# Patient Record
Sex: Female | Born: 1950 | Race: Black or African American | Hispanic: No | Marital: Married | Smoking: Never smoker
Health system: Southern US, Community
[De-identification: ages and names within clinical notes are randomized; demographics above are authoritative.]

## PROBLEM LIST (undated history)

## (undated) DIAGNOSIS — D649 Anemia, unspecified: Secondary | ICD-10-CM

## (undated) DIAGNOSIS — E785 Hyperlipidemia, unspecified: Secondary | ICD-10-CM

## (undated) DIAGNOSIS — K8020 Calculus of gallbladder without cholecystitis without obstruction: Secondary | ICD-10-CM

## (undated) DIAGNOSIS — F329 Major depressive disorder, single episode, unspecified: Secondary | ICD-10-CM

## (undated) DIAGNOSIS — N200 Calculus of kidney: Secondary | ICD-10-CM

## (undated) DIAGNOSIS — F32A Depression, unspecified: Secondary | ICD-10-CM

## (undated) DIAGNOSIS — K635 Polyp of colon: Secondary | ICD-10-CM

## (undated) HISTORY — DX: Major depressive disorder, single episode, unspecified: F32.9

## (undated) HISTORY — DX: Calculus of gallbladder without cholecystitis without obstruction: K80.20

## (undated) HISTORY — DX: Calculus of kidney: N20.0

## (undated) HISTORY — PX: KNEE SURGERY: SHX244

## (undated) HISTORY — DX: Polyp of colon: K63.5

## (undated) HISTORY — DX: Anemia, unspecified: D64.9

## (undated) HISTORY — DX: Hyperlipidemia, unspecified: E78.5

## (undated) HISTORY — PX: ABDOMINAL HYSTERECTOMY: SHX81

## (undated) HISTORY — DX: Depression, unspecified: F32.A

## (undated) HISTORY — PX: CHOLECYSTECTOMY: SHX55

---

## 2001-10-01 ENCOUNTER — Inpatient Hospital Stay (HOSPITAL_COMMUNITY): Admission: EM | Admit: 2001-10-01 | Discharge: 2001-10-03 | Payer: Self-pay | Admitting: Emergency Medicine

## 2003-02-17 ENCOUNTER — Emergency Department (HOSPITAL_COMMUNITY): Admission: EM | Admit: 2003-02-17 | Discharge: 2003-02-18 | Payer: Self-pay | Admitting: Emergency Medicine

## 2003-02-18 ENCOUNTER — Encounter: Payer: Self-pay | Admitting: Emergency Medicine

## 2005-10-02 ENCOUNTER — Other Ambulatory Visit: Admission: RE | Admit: 2005-10-02 | Discharge: 2005-10-02 | Payer: Self-pay | Admitting: Obstetrics and Gynecology

## 2005-12-10 ENCOUNTER — Ambulatory Visit (HOSPITAL_BASED_OUTPATIENT_CLINIC_OR_DEPARTMENT_OTHER): Admission: RE | Admit: 2005-12-10 | Discharge: 2005-12-10 | Payer: Self-pay | Admitting: Specialist

## 2006-02-07 ENCOUNTER — Encounter: Admission: RE | Admit: 2006-02-07 | Discharge: 2006-02-07 | Payer: Self-pay | Admitting: Obstetrics and Gynecology

## 2006-06-18 ENCOUNTER — Emergency Department (HOSPITAL_COMMUNITY): Admission: EM | Admit: 2006-06-18 | Discharge: 2006-06-18 | Payer: Self-pay | Admitting: Emergency Medicine

## 2006-06-27 ENCOUNTER — Encounter: Admission: RE | Admit: 2006-06-27 | Discharge: 2006-06-27 | Payer: Self-pay | Admitting: Gastroenterology

## 2006-09-18 ENCOUNTER — Ambulatory Visit (HOSPITAL_COMMUNITY): Admission: RE | Admit: 2006-09-18 | Discharge: 2006-09-18 | Payer: Self-pay | Admitting: General Surgery

## 2006-09-18 ENCOUNTER — Encounter (INDEPENDENT_AMBULATORY_CARE_PROVIDER_SITE_OTHER): Payer: Self-pay | Admitting: Specialist

## 2007-01-05 ENCOUNTER — Emergency Department (HOSPITAL_COMMUNITY): Admission: EM | Admit: 2007-01-05 | Discharge: 2007-01-05 | Payer: Self-pay | Admitting: Emergency Medicine

## 2007-07-24 ENCOUNTER — Encounter: Admission: RE | Admit: 2007-07-24 | Discharge: 2007-07-24 | Payer: Self-pay | Admitting: Obstetrics and Gynecology

## 2007-08-07 ENCOUNTER — Encounter: Admission: RE | Admit: 2007-08-07 | Discharge: 2007-08-07 | Payer: Self-pay | Admitting: Obstetrics and Gynecology

## 2008-08-16 ENCOUNTER — Encounter: Admission: RE | Admit: 2008-08-16 | Discharge: 2008-08-16 | Payer: Self-pay | Admitting: Obstetrics and Gynecology

## 2008-11-14 ENCOUNTER — Emergency Department (HOSPITAL_COMMUNITY): Admission: EM | Admit: 2008-11-14 | Discharge: 2008-11-14 | Payer: Self-pay | Admitting: Emergency Medicine

## 2008-11-14 IMAGING — CR DG FINGER INDEX 2+V*R*
3 series · 3 of 3 positions shown · non-contrast
Comparison: None

CLINICAL DATA: Trauma to the right index finger.  Laceration over
the posterior aspect of the distal phalanx.

RIGHT INDEX FINGER 2+V

[x finger pa right]
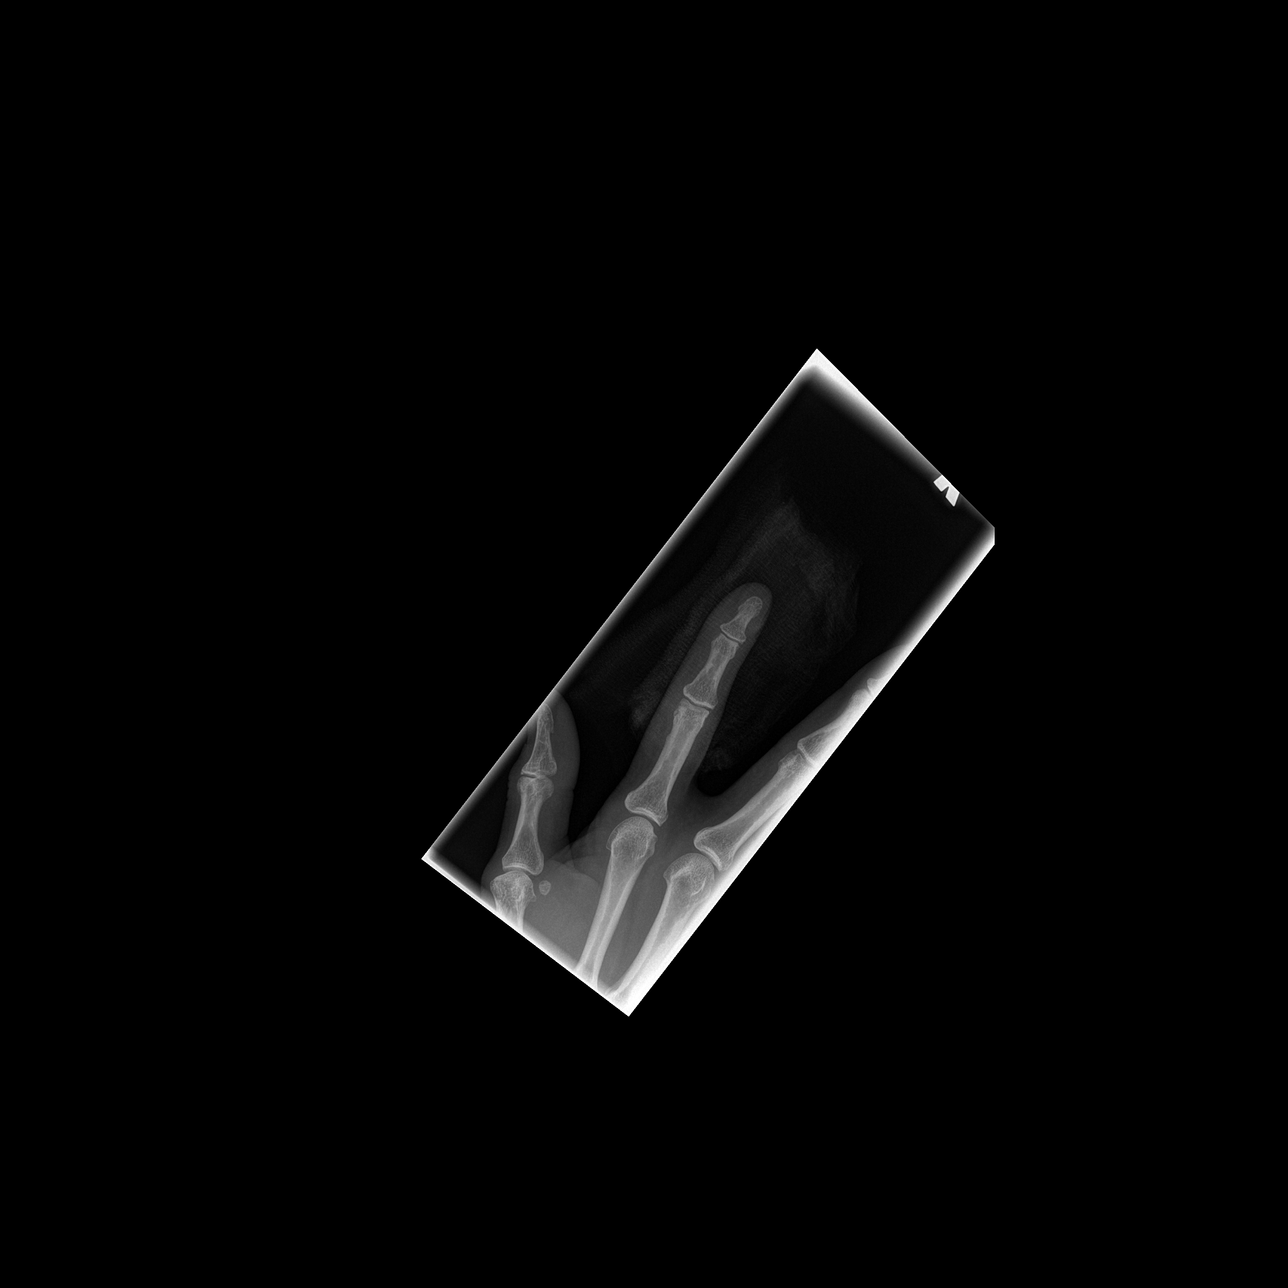

[x finger obl. right]
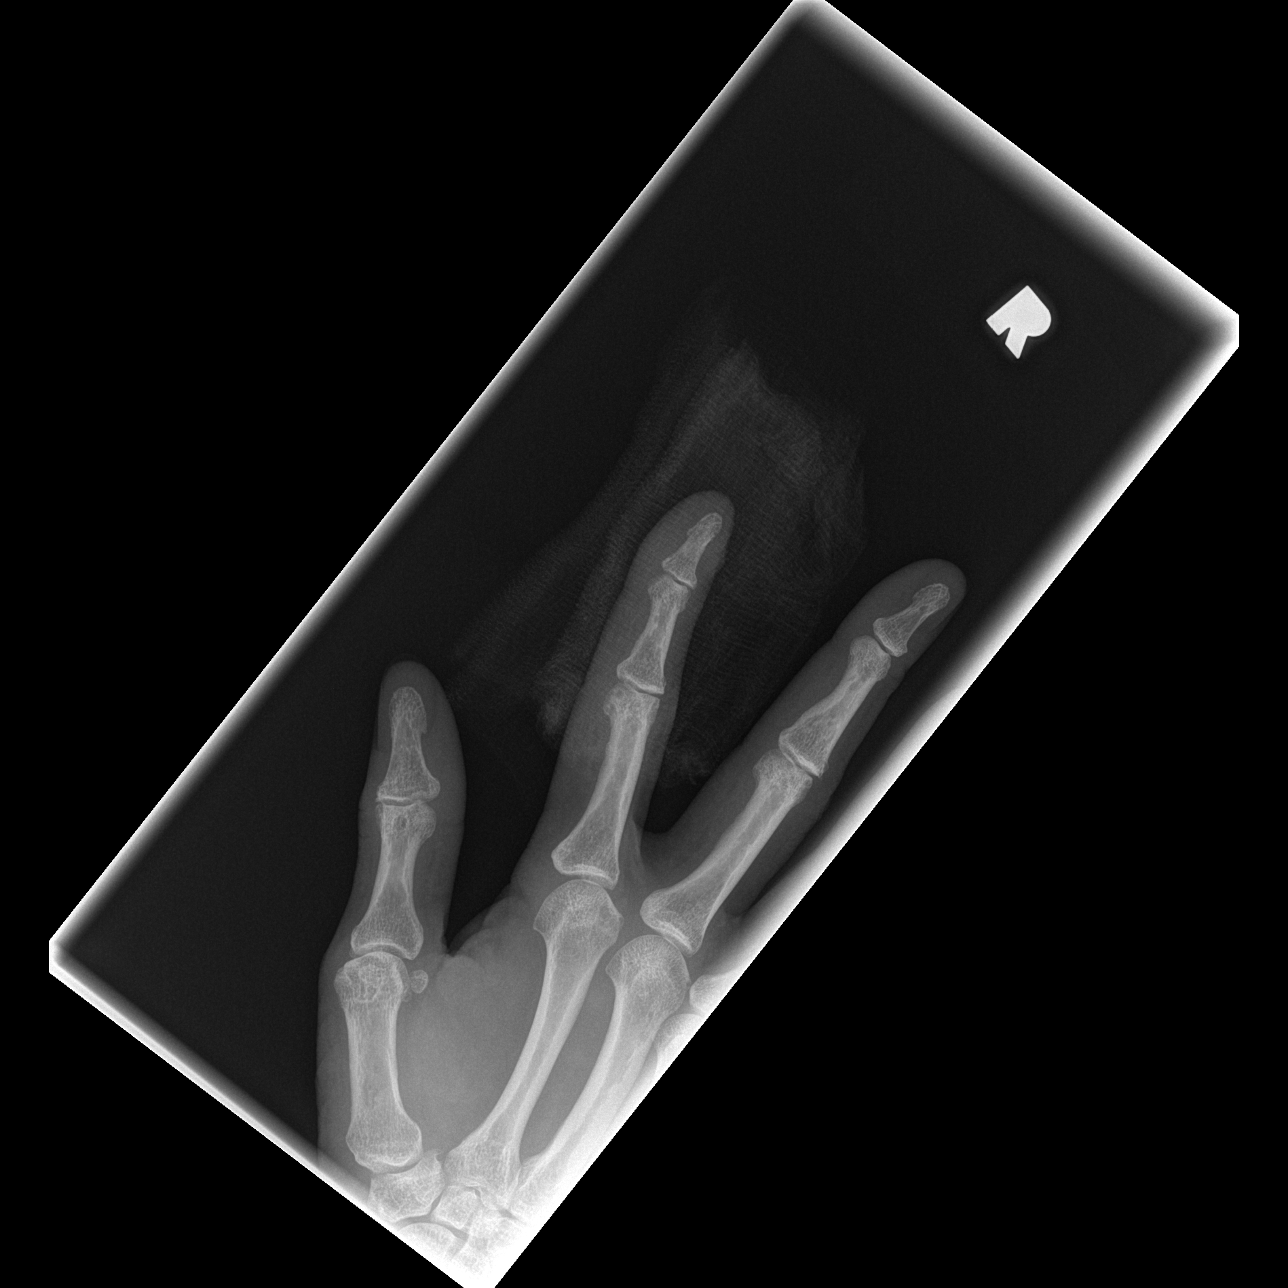

[x finger lateral right]
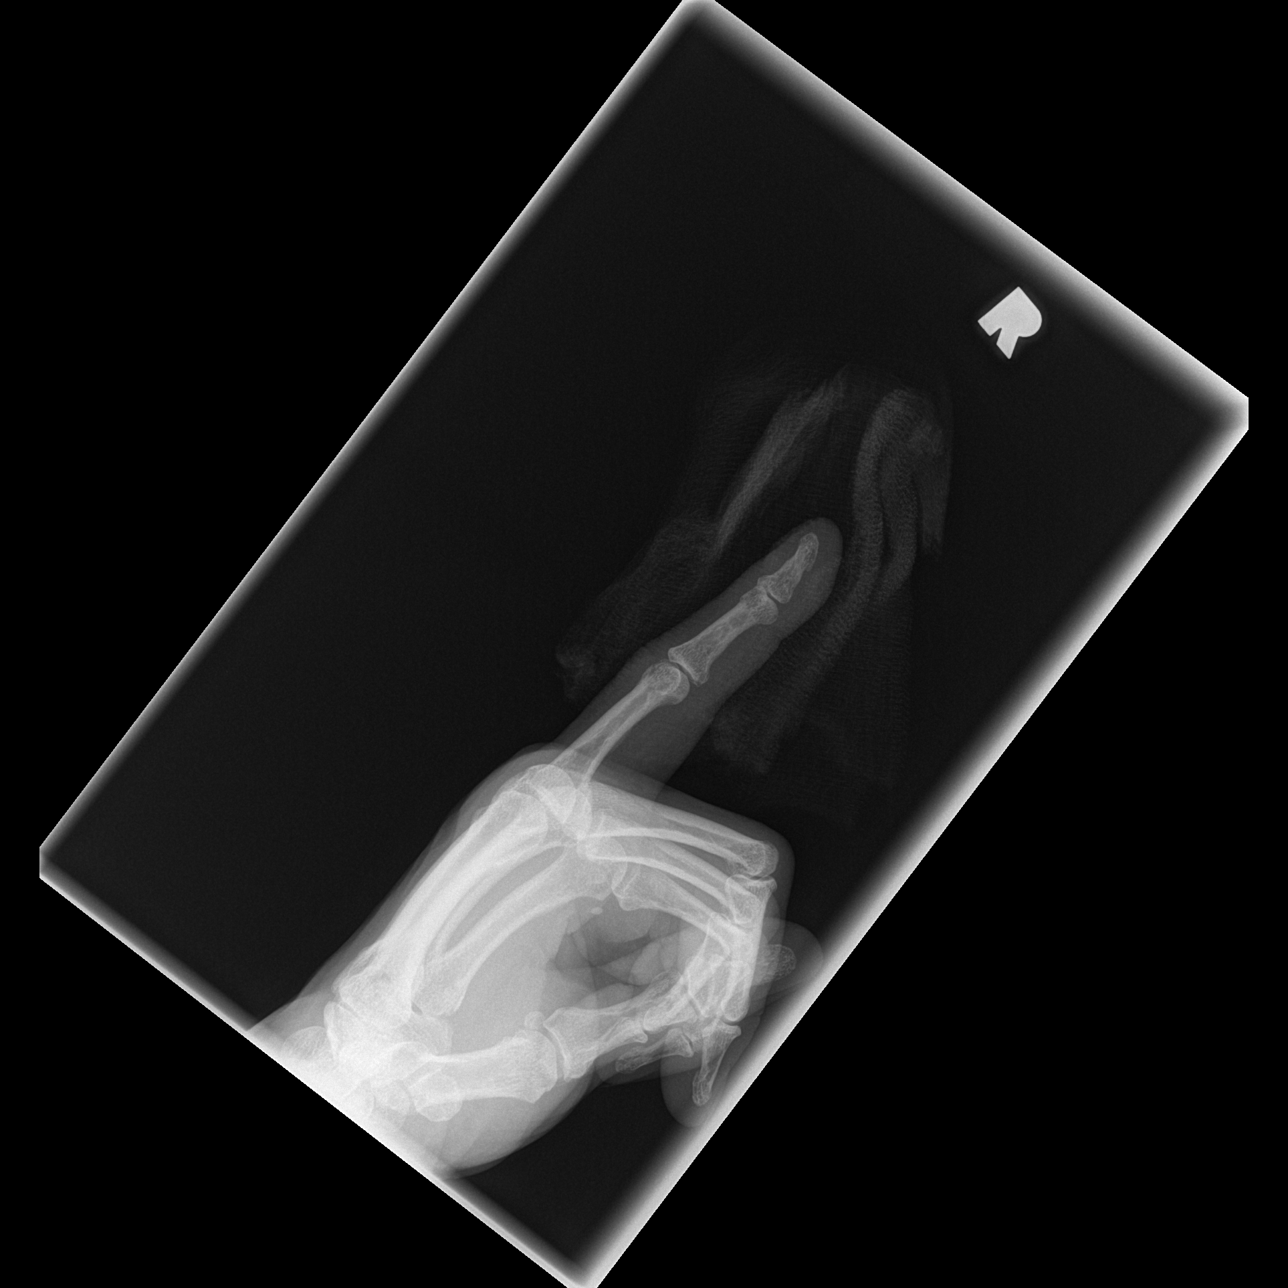

[3 of 3 positions shown; findings below may reference images not displayed]

FINDINGS: Gauze overlies the right second digit and obscures fine
osseous detail.  No radiopaque foreign body is identified.  No
definite tuft fracture is seen.
IMPRESSION: No definite fracture identified in the right second digit, although
fine detail is obscured by the gauze.  If there is high clinical
suspicion for fracture, images could be repeated without gauze
present.

## 2009-09-23 ENCOUNTER — Encounter: Admission: RE | Admit: 2009-09-23 | Discharge: 2009-09-23 | Payer: Self-pay | Admitting: Obstetrics and Gynecology

## 2010-06-11 ENCOUNTER — Encounter: Payer: Self-pay | Admitting: Obstetrics and Gynecology

## 2010-10-06 NOTE — H&P (Signed)
[place]. [facility]  Patient:    Shirley Aguilar, Shirley Aguilar Visit Number: [id number] MRN: [id number]          Service Type: MED Location: [phone] Attending Physician:  Rogelia Boga Dictated by:   Gordy Savers, M.D. Admit Date:  09/30/2001                           History and Physical  CHIEF COMPLAINT:  Nausea and vomiting.  HISTORY OF PRESENT ILLNESS:  The patient is a 60 year old black female who was stable until one day prior to admission.  She returned from work feeling well and following dinner had the onset of nausea and vomiting associated with crampy abdominal pain.  She states that she ate chicken that was purchased one week prior but remained refrigerated but not frozen.  Her husband ate dinner, but had not consumed the same portions of chicken. The patient denies any significant GI history and no further episodes in the past.  She enjoys excellent health and takes no chronic medications.  There has been no fever.  Several hours into the illness, she had the onset of diarrhea.  The patient was treated in the emergency department where septic series revealed normal findings.  LABORATORY STUDIES:  Normal white count and normal chemistries.  The patient was treated symptomatically with Phenergan, Zofran but continued to have intractable nausea and vomiting and finally nasogastric tube was placed with resolution of her symptoms.  The patient is now admitted for further evaluation and treatment of intractable nausea and vomiting.  PAST MEDICAL HISTORY:  Fairly noncontributory.  She is Gravida 2, Para 2, Abortis 0.  She has been hospitalized on two occasions for surgery for ovarian cysts or tumors, more recently here in [facility] by Dr. Huntley Dec.  She takes no chronic medications and is in the process of tapering hormone replacement therapy.  FAMILY HISTORY:  Father died when she was young, unclear causes.  Mother is in good  health as are one brother and four sisters.  One sister has probably obstructive sleep apnea.  PHYSICAL EXAMINATION:  VITAL SIGNS:  Afebrile, vital signs were stable.  Pulse mid 70s.  The patient was in no distress.  SKIN:  Unremarkable.  HEENT:  Normal pupillary responses.  Conjunctivae are clear.  ENT negative. Nasogastric tube in place  CHEST:  Clear anterolaterally.  CARDIOVASCULAR:  Normal S1 and S2, no murmurs or gallops.  ABDOMEN:  Quiet, nondistended.  There is mild generalized tenderness but no guarding.  EXTREMITIES:  Extremities are negative.  PULSES:  Peripheral pulses are full.  IMPRESSION:  Intractable nausea and vomiting, probable food poisoning.  DISPOSITION:  Will admit to the hospital, support with IV fluids and continue symptomatic treatment.  Will continue on Pepcid 10 mg IV every 12 hours.  Her nasogastric tube will be clamped.  She will remain stable and after several hours this will be discontinued.  She will be challenged with clear liquid diet. Dictated by:   Gordy Savers, M.D. Attending Physician:  Rogelia Boga DD:  10/01/01 TD:  10/02/01 Job: [id number]

## 2010-10-06 NOTE — Op Note (Signed)
Shirley Aguilar, Shirley Aguilar             ACCOUNT NO.:  [id number]   MEDICAL RECORD NO.:  [id number]          PATIENT TYPE:  AMB   LOCATION:  DAY                          FACILITY[facility]  [facility]   PHYSICIAN:  Adolph Pollack, M.D.DATE OF BIRTH:  [birth year]   DATE OF PROCEDURE:  09/18/2006  DATE OF DISCHARGE:                               OPERATIVE REPORT   PREOPERATIVE DIAGNOSIS:  Symptomatic cholelithiasis.   POSTOPERATIVE DIAGNOSIS:  Symptomatic cholelithiasis.   PROCEDURE:  Laparoscopic cholecystectomy with intraoperative  cholangiogram.   SURGEON:  Adolph Pollack, M.D.   ASSISTANT:  Karie Soda, M.D.   ANESTHESIA:  General.   INDICATION:  Ms. Witz is a 60 year old female who has had two  significant bouts of right upper quadrant pain with nausea and vomiting.  She ended up in the emergency room after one of these bouts.  She had an  ultrasound demonstrating multiple gallstones but no gallbladder wall  thickening.  The common bile duct was normal measuring 5.4 mm.  She  subsequently now presents for laparoscopic cholecystectomy.  We have  discussed the procedure and risks preoperatively.   TECHNIQUE:  She is brought to the operating room, placed supine on the  operating table, and a general anesthetic was administered.  The  abdominal wall was sterilely prepped and draped.  Marcaine was  infiltrated in the supraumbilical region.  A small supraumbilical  incision was made through the skin, subcutaneous tissue, fascia, and  peritoneum entering the peritoneal cavity under direct vision.  A  pursestring suture of 0 Vicryl was placed around the fascial edges.  A  Hassan trocar was introduced into the peritoneal cavity and the  pneumoperitoneum was created by insufflation of CO2 gas.   The laparoscope was introduced.  She was placed in reversed  Trendelenburg position and the right side tilted slightly up.  An 11 mm  trocar was placed in the epigastrium and two 5 mm trocars placed  in the  right upper quadrant.  The fundus of the gallbladder was grasped.  The  gallbladder was abnormal appearing to have a pale red color consistent  with chronic inflammatory changes.  The fundus was retracted toward the  right shoulder.  There were thin adhesions between the duodenum and the  body and infundibulum of the gallbladder which were lysed sharply and  the duodenum was carefully swept bluntly away.   The infundibulum was then grasped and using careful dissection on the  gallbladder, it was mobilized.  The gallbladder neck tapered down to a  slightly enlarged cystic duct.  I created a window around the cystic  duct as well as creating windows around the anterior and posterior  branches of the cystic artery close to the gallbladder.  I placed a clip  at the gallbladder neck and made an incision in the cystic duct.  I then  milked some debris back to the cystic duct out through the incision in  it.  A cholangiocath was passed through the anterior abdominal wall and  a cholangiogram performed.   Under real time fluoroscopy, dilute contrast was injected into the  cystic duct area.  The cystic duct was relatively short.  The common  hepatic, right and left hepatic, and common bile ducts were all  opacified and contrast passed into the duodenum without obvious evidence  of obstruction.  Final ports were pending the radiologist's  interpretation.  I removed the cholangiocath.  I clipped the cystic duct  twice and divided it.  I then placed an Endoloop on the cystic duct as  well given it a slightly increased diameter.   The gallbladder was then dissected free from the liver using  electrocautery and placed in an Endopouch bag.  I copiously irrigated  out the gallbladder fossa.  No bleeding or bile leak was noted.  Irrigation fluid was evacuated as much as possible.  The gallbladder was  then removed in the bag through the supraumbilical port and the  supraumbilical fascial  defect was closed under laparoscopic vision by  tightening up and tying down the pursestring suture.  The remaining  trocars were removed and the pneumoperitoneum was released.  The skin  incisions were closed with a 4-0 Monocryl subcuticular stitches.  Steri-  Strips and sterile dressings were applied.   She tolerated the procedure well without any apparent complications and  was taken to recovery in satisfactory condition.      Adolph Pollack, M.D.  Electronically Signed     TJR/MEDQ  D:  09/18/2006  T:  09/18/2006  Job:  [id number]   cc:   Graylin Shiver, M.D.  Fax: [phone]

## 2010-10-06 NOTE — Discharge Summary (Signed)
[place]. [facility]  Patient:    Shirley Aguilar, Shirley Aguilar Visit Number: [id number] MRN: [id number]          Service Type: MED Location: [phone] Attending Physician:  Rogelia Boga Dictated by:   Cornell Barman, P.A. Admit Date:  09/30/2001 Discharge Date: 10/03/2001   CC:         Gordy Savers, M.D.   Discharge Summary  DISCHARGE DIAGNOSES: 1. Intractable nausea and vomiting. 2. Mildly elevated temperature.  BRIEF HISTORY:  Shirley Aguilar is a 60 year old African-American female.  She was stable until the day prior to this admission.  She had returned from work feeling well, and following dinner had developed nausea and vomiting.  This was associated with crampy abdominal pain.  She did have some diarrhea as well.  The patient was evaluated in the emergency department where acute abdominal series showed no acute abnormalities.  The patient was admitted for IV fluids and antiemetics.  PAST MEDICAL HISTORY:  History of ovarian cysts or tumors reportedly benign followed by Dr. Henderson Cloud.  HOSPITAL COURSE:  #1 - GI.  The patient presented with intractable nausea and vomiting and some diarrhea.  Acute abdominal series was unremarkable.  Urinalysis was unremarkable.  Hepatic function profile was normal.  Lipase was normal.  The patient had an NG tube placed for decompression.  The patient symptomatically improved with NG tube decompression and antiemetics.  The patient does give a history of total abdominal hysterectomy secondary to tumor greater than two years ago, but denies any history of adhesions.  The patients condition did improve with IV fluids, NG tube decompression, and antiemetics.  The NG tube was discontinued and she was started on clear liquids which she tolerated. Currently her IV fluids have been discontinued and her diet has been advanced. Most likely this was a viral gastroenteritis.  The patient is medically stable for  discharge.  DISCHARGE LABORATORY DATA:  CBC with differential was normal.  Potassium was 3.4, lipase was 26.  Urinalysis revealed trace leukocyte esterase with 0 to 2 white cells.  Hepatic function profile was normal.  DISCHARGE MEDICATIONS:  None.  FOLLOW-UP:  At [facility] as needed. Dictated by:   Cornell Barman, P.A. Attending Physician:  Rogelia Boga DD:  10/03/01 TD:  10/06/01 Job: [id number]

## 2010-10-06 NOTE — Op Note (Signed)
Shirley, Aguilar             ACCOUNT NO.:  [id number]   MEDICAL RECORD NO.:  [id number]          PATIENT TYPE:  AMB   LOCATION:  [facility]                         FACILITY:  [facility]   PHYSICIAN:  Jene Every, M.D.    DATE OF BIRTH:  08-Jan-1951   DATE OF PROCEDURE:  12/10/2005  DATE OF DISCHARGE:                                 OPERATIVE REPORT   PREOPERATIVE DIAGNOSES:  Lateral meniscus tear, medial meniscus tear.   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   PROCEDURE PERFORMED:  Right knee arthroscopy, partial medial and lateral  meniscectomies, chondroplasties of the medial and femoral condyle and  lateral tibial plateau and of the patella.   BRIEF HISTORY AND INDICATIONS:  A 60 year old with knee pain.  MRI indicated  anterior horn and lateral meniscus tear and tricompartmental degenerative  changes.  She has been refractory to conservative treatment despite rest,  corticosteroid injection, anti-inflammatories.  MRI indicating tear with  mechanical symptoms, she was indicated for partial meniscectomy and  debridement.  Risks and benefits discussed, including bleeding, infection,  injury to vascular structures, no change in symptoms, worsening of symptoms,  need for repeat debridement in the future, or total knee arthroplasty.   TECHNIQUE:  With the patient in the supine position, after the induction of  adequate general anesthesia and 1 gram of Kefzol, the right lower extremity  was prepped and draped in the usual sterile fashion.  A lateral parapatellar  portal and a superomedial parapatellar portal were fashioned with a #11  blade after localization.  An ingress cannula was introduced, and irrigant  was utilized to insufflate the  joint.  Camera inserted laterally.  Under  direct visualization, a medial parapatellar portal was fashioned with a #11  blade after localization with an 18-gauge needle, sparing the medial  meniscus.  Noted initially was some loose cartilaginous material.  It was  evacuated.  There was a large chondral flap tear and extensive grade 3  changes of the medial femoral condyle.  There was a complex tear of the  posterior horn of the medial meniscus, unstable.  Basket rongeur was  introduced and utilized to perform a partial medial meniscectomy to a stable  base.  Chondral flap tears were debrided with a 42 Kuda shaver.  There were  extensive changes noted.  The remnant of the meniscus was stable to probe  palpation.  This was the inner two thirds that were resected of the  posterior third.  ACL and PCL were unremarkable.  There was a tear of the  anterior horn of lateral meniscus as well as the mid point of the meniscus,  and there were extensive grade 3 changes and small grade 4 changes of the  tibial plateau and grade 3 changes of the femoral condyle.  The shaver was  introduced and utilized to shave and perform a partial lateral meniscectomy  of the anterior  horn and chondroplasties of the tibial plateau and of the  femoral condyle.  The remainder of the meniscus was stable to probe  palpation without evidence of tear.  Chondroplasty of the patella was  performed.  There was normal patellofemoral tracking.  The gutters were  unremarkable.  The knee was copiously lavaged.  I revisited all the  compartments, and there was no loose cartilaginous debris or other pathology  amenable to arthroscopic intervention.  Next, all instrumentation was  removed.  The portals were closed with 4-0 nylon in a simple suture, and  0.25% Marcaine was infiltrated, and the  joint wounds dressed sterilely.  She was awakened without difficulty and  transported to the recovery room in satisfactory condition.   The  patient tolerated the procedure well with no complications.      Jene Every, M.D.  Electronically Signed     JB/MEDQ  D:  12/10/2005  T:  12/10/2005  Job:  [id number]

## 2010-12-04 ENCOUNTER — Other Ambulatory Visit: Payer: Self-pay | Admitting: Obstetrics and Gynecology

## 2010-12-04 DIAGNOSIS — Z1231 Encounter for screening mammogram for malignant neoplasm of breast: Secondary | ICD-10-CM

## 2010-12-08 ENCOUNTER — Ambulatory Visit
Admission: RE | Admit: 2010-12-08 | Discharge: 2010-12-08 | Disposition: A | Discharge status: Home / Self Care | Payer: BC Managed Care – PPO | Source: Ambulatory Visit | Attending: Obstetrics and Gynecology | Admitting: Obstetrics and Gynecology

## 2010-12-08 DIAGNOSIS — Z1231 Encounter for screening mammogram for malignant neoplasm of breast: Secondary | ICD-10-CM

## 2011-03-02 LAB — COMPREHENSIVE METABOLIC PANEL
Albumin: 3.7
CO2: 28
Calcium: 9
Chloride: 111
Creatinine, Ser: 0.78
GFR calc non Af Amer: >60
Glucose, Bld: 121 — ABNORMAL HIGH
Sodium: 145
Total Protein: 6.7

## 2011-03-02 LAB — DIFFERENTIAL
Lymphocytes Relative: 4 — ABNORMAL LOW
Lymphs Abs: 0.4 — ABNORMAL LOW
Monocytes Relative: 8
Neutrophils Relative %: 84 — ABNORMAL HIGH

## 2011-03-02 LAB — CBC
HCT: 37.6
MCHC: 33
Platelets: 174
RDW: 14.2 — ABNORMAL HIGH
WBC: 11.3 — ABNORMAL HIGH

## 2011-03-02 LAB — URINALYSIS, ROUTINE W REFLEX MICROSCOPIC
Glucose, UA: NEGATIVE
Nitrite: NEGATIVE
Protein, ur: NEGATIVE

## 2011-03-02 LAB — LIPASE, BLOOD: Lipase: 19

## 2012-05-27 ENCOUNTER — Other Ambulatory Visit: Payer: Self-pay | Admitting: Gynecology

## 2012-05-27 ENCOUNTER — Other Ambulatory Visit: Payer: Self-pay | Admitting: Obstetrics and Gynecology

## 2012-05-27 DIAGNOSIS — R928 Other abnormal and inconclusive findings on diagnostic imaging of breast: Secondary | ICD-10-CM

## 2012-06-03 ENCOUNTER — Ambulatory Visit
Admission: RE | Admit: 2012-06-03 | Discharge: 2012-06-03 | Disposition: A | Discharge status: Home / Self Care | Payer: BC Managed Care – PPO | Source: Ambulatory Visit | Attending: Gynecology | Admitting: Gynecology

## 2012-06-03 DIAGNOSIS — R928 Other abnormal and inconclusive findings on diagnostic imaging of breast: Secondary | ICD-10-CM

## 2012-08-28 ENCOUNTER — Other Ambulatory Visit: Payer: Self-pay | Admitting: Physician Assistant

## 2012-08-28 ENCOUNTER — Ambulatory Visit
Admission: RE | Admit: 2012-08-28 | Discharge: 2012-08-28 | Disposition: A | Discharge status: Home / Self Care | Payer: No Typology Code available for payment source | Source: Ambulatory Visit | Attending: Physician Assistant | Admitting: Physician Assistant

## 2012-08-28 DIAGNOSIS — R52 Pain, unspecified: Secondary | ICD-10-CM

## 2013-04-01 ENCOUNTER — Emergency Department (INDEPENDENT_AMBULATORY_CARE_PROVIDER_SITE_OTHER)
Admission: EM | Admit: 2013-04-01 | Discharge: 2013-04-01 | Disposition: A | Discharge status: Home / Self Care | Payer: Self-pay | Source: Home / Self Care | Attending: Emergency Medicine | Admitting: Emergency Medicine

## 2013-04-01 ENCOUNTER — Encounter (HOSPITAL_COMMUNITY): Payer: Self-pay | Admitting: Emergency Medicine

## 2013-04-01 DIAGNOSIS — E119 Type 2 diabetes mellitus without complications: Secondary | ICD-10-CM

## 2013-04-01 DIAGNOSIS — J029 Acute pharyngitis, unspecified: Secondary | ICD-10-CM

## 2013-04-01 DIAGNOSIS — H1045 Other chronic allergic conjunctivitis: Secondary | ICD-10-CM

## 2013-04-01 DIAGNOSIS — H1013 Acute atopic conjunctivitis, bilateral: Secondary | ICD-10-CM

## 2013-04-01 NOTE — ED Notes (Signed)
C/o ST since yesterday, w nasal congestion; NAD, afebrile at present

## 2013-04-01 NOTE — ED Provider Notes (Signed)
CSN: [id number]     Arrival date & time 04/01/13  6962 History   First MD Initiated Contact with Patient 04/01/13 0827     Chief Complaint  Patient presents with  . Sore Throat   (Consider location/radiation/quality/duration/timing/severity/associated sxs/prior Treatment) HPI Comments: 62f presents complaining of sore throat since yesterday as well as nasal congestion. Also, she mentions that her A1c was 6.8 a recent health fair and she has been having itchy eyes with intermittent blurred vision for about 6 months. Sore throat nasal congestion have been present for about 3 days. She has taken over-the-counter medications which do help slightly. She denies fever, chills, recent travel, sick contacts, cough, throat swelling.  Patient is a 62 y.o. female presenting with pharyngitis.  Sore Throat Pertinent negatives include no chest pain, no abdominal pain and no shortness of breath.    History reviewed. No pertinent past medical history. Past Surgical History  Procedure Laterality Date  . Cholecystectomy     History reviewed. No pertinent family history. History  Substance Use Topics  . Smoking status: Never Smoker   . Smokeless tobacco: Not on file  . Alcohol Use: No   OB History   Grav Para Term Preterm Abortions TAB SAB Ect Mult Living                 Review of Systems  Constitutional: Negative for fever and chills.  HENT: Positive for congestion and sore throat.   Eyes: Positive for itching and visual disturbance.  Respiratory: Negative for cough and shortness of breath.   Cardiovascular: Negative for chest pain, palpitations and leg swelling.  Gastrointestinal: Negative for nausea, vomiting and abdominal pain.  Endocrine: Negative for polydipsia and polyuria.  Genitourinary: Negative for dysuria, urgency and frequency.  Musculoskeletal: Negative for arthralgias and myalgias.  Skin: Negative for rash.  Neurological: Negative for dizziness, weakness and light-headedness.     Allergies  Review of patient's allergies indicates no known allergies.  Home Medications  No current outpatient prescriptions on file. BP 141/86  Pulse 63  Temp(Src) 97.4 F (36.3 C) (Oral)  Resp 16  SpO2 98% Physical Exam  Nursing note and vitals reviewed. Constitutional: She is oriented to person, place, and time. Vital signs are normal. She appears well-developed and well-nourished. No distress.  HENT:  Head: Normocephalic and atraumatic.  Mouth/Throat: Posterior oropharyngeal erythema (mild) present. No oropharyngeal exudate, posterior oropharyngeal edema or tonsillar abscesses.  Eyes: Conjunctivae and EOM are normal. Pupils are equal, round, and reactive to light. Right eye exhibits no discharge. Left eye exhibits no discharge. No scleral icterus.  Neck: Normal range of motion. Neck supple.  Cardiovascular: Normal rate, regular rhythm and normal heart sounds.  Exam reveals no gallop and no friction rub.   No murmur heard. Pulmonary/Chest: Effort normal and breath sounds normal. No respiratory distress.  Lymphadenopathy:    She has no cervical adenopathy.  Neurological: She is alert and oriented to person, place, and time. She has normal strength. Coordination normal.  Skin: Skin is warm and dry. No rash noted. She is not diaphoretic.  Psychiatric: She has a normal mood and affect. Judgment normal.    ED Course  Procedures (including critical care time) Labs Review Labs Reviewed  POCT RAPID STREP A ([facility] URG CARE ONLY)   Imaging Review No results found.    MDM   1. Viral pharyngitis   2. Allergic conjunctivitis, bilateral   3. Diabetes mellitus, type 2    Viral pharyngitis, treat symptomatically with dayquil and  nyquil.  Ketotifen drops for itchy eyes.  Discussed initial conservative management of DM, she will work on diet and exercise and f/u with community health clinic.      Graylon Good, PA-C 04/01/13 [phone]

## 2013-04-03 LAB — CULTURE, GROUP A STREP

## 2013-04-04 NOTE — ED Provider Notes (Signed)
Medical screening examination/treatment/procedure(s) were performed by a resident physician or non-physician practitioner and as the supervising physician I was immediately available for consultation/collaboration.  Juley Giovanetti, MD    Teola Felipe S Aalyah Mansouri, MD 04/04/13 0910 

## 2013-04-13 ENCOUNTER — Ambulatory Visit: Payer: Self-pay | Attending: Internal Medicine | Admitting: Internal Medicine

## 2013-04-13 ENCOUNTER — Encounter: Payer: Self-pay | Admitting: Internal Medicine

## 2013-04-13 VITALS — BP 135/90 | HR 67 | Temp 97.7°F | Resp 16 | Wt 169.8 lb

## 2013-04-13 DIAGNOSIS — IMO0001 Reserved for inherently not codable concepts without codable children: Secondary | ICD-10-CM | POA: Insufficient documentation

## 2013-04-13 DIAGNOSIS — Z131 Encounter for screening for diabetes mellitus: Secondary | ICD-10-CM

## 2013-04-13 DIAGNOSIS — Z23 Encounter for immunization: Secondary | ICD-10-CM

## 2013-04-13 DIAGNOSIS — Z139 Encounter for screening, unspecified: Secondary | ICD-10-CM

## 2013-04-13 DIAGNOSIS — R030 Elevated blood-pressure reading, without diagnosis of hypertension: Secondary | ICD-10-CM | POA: Insufficient documentation

## 2013-04-13 LAB — COMPLETE METABOLIC PANEL WITH GFR
ALT: 16 U/L (ref 0–35)
AST: 22 U/L (ref 0–37)
Albumin: 4.6 g/dL (ref 3.5–5.2)
CO2: 30 mEq/L (ref 19–32)
Calcium: 9.8 mg/dL (ref 8.4–10.5)
Chloride: 105 mEq/L (ref 96–112)
Creat: 0.79 mg/dL (ref 0.50–1.10)
GFR, Est African American: >89 mL/min
GFR, Est Non African American: 80 mL/min
Glucose, Bld: 83 mg/dL (ref 70–99)
Sodium: 145 mEq/L (ref 135–145)
Total Bilirubin: 0.5 mg/dL (ref 0.3–1.2)
Total Protein: 7.7 g/dL (ref 6.0–8.3)

## 2013-04-13 LAB — CBC WITH DIFFERENTIAL/PLATELET
Basophils Absolute: 0 10*3/uL (ref 0.0–0.1)
Basophils Relative: 1 % (ref 0–1)
Eosinophils Absolute: 0.1 10*3/uL (ref 0.0–0.7)
Eosinophils Relative: 3 % (ref 0–5)
HCT: 36.8 % (ref 36.0–46.0)
Hemoglobin: 12.4 g/dL (ref 12.0–15.0)
Lymphs Abs: 1.3 10*3/uL (ref 0.7–4.0)
MCH: 30.2 pg (ref 26.0–34.0)
MCHC: 33.7 g/dL (ref 30.0–36.0)
Monocytes Absolute: 0.3 10*3/uL (ref 0.1–1.0)
Monocytes Relative: 9 % (ref 3–12)
Neutro Abs: 1.8 10*3/uL (ref 1.7–7.7)
Neutrophils Relative %: 51 % (ref 43–77)
RDW: 14.2 % (ref 11.5–15.5)

## 2013-04-13 LAB — LIPID PANEL
LDL Cholesterol: 174 mg/dL — ABNORMAL HIGH (ref 0–99)
Total CHOL/HDL Ratio: 3.3 Ratio
Triglycerides: 77 mg/dL (ref <150)
VLDL: 15 mg/dL (ref 0–40)

## 2013-04-13 LAB — GLUCOSE, POCT (MANUAL RESULT ENTRY): POC Glucose: 108 mg/dl — AB (ref 70–99)

## 2013-04-13 LAB — POCT GLYCOSYLATED HEMOGLOBIN (HGB A1C): Hemoglobin A1C: 5.9

## 2013-04-13 NOTE — Progress Notes (Signed)
Patient here to establish a primary care doctor Shirley Aguilar to a free screening and was told she had type 2 diabetes Takes no medication for diabetes

## 2013-04-13 NOTE — Progress Notes (Signed)
Patient ID: Shirley Aguilar, female   DOB: Feb 06, 1951, 62 y.o.   MRN: [id number] MRN: [id number] Name: Shirley Aguilar  Sex: female Age: 62 y.o. DOB: Mar 03, 1951  Allergies: Review of patient's allergies indicates no known allergies.  Chief Complaint  Patient presents with   Establish Care    HPI: Patient is 62 y.o. female who comes for the first [facility] care, as per patient she went to have her and was told she might have diabetes and wanted to be checked, denies any headache dizziness chest and shortness of breath or any acute symptoms, she has 1 history of hypertension today her blood pressure is borderline high she had a colonoscopy done in the past. Patient was also following up with the gynecologist and had a hysterectomy done.  History reviewed. No pertinent past medical history.  Past Surgical History  Procedure Laterality Date   Cholecystectomy     Abdominal hysterectomy        Medication List       This list is accurate as of: 04/13/13 12:45 PM.  Always use your most recent med list.               CALCIUM + D + K PO  Take by mouth.        Meds ordered this encounter  Medications   Calcium-Vitamin D-Vitamin K (CALCIUM + D + K PO)    Sig: Take by mouth.     There is no immunization history on file for this patient.  History  Substance Use Topics   Smoking status: Never Smoker    Smokeless tobacco: Not on file   Alcohol Use: No    Review of Systems  As noted in HPI  Filed Vitals:   04/13/13 1202  BP: 135/90  Pulse: 67  Temp: 97.7 F (36.5 C)  Resp: 16    Physical Exam  Physical Exam  Constitutional: She is oriented to person, place, and time. No distress.  Eyes: EOM are normal. Pupils are equal, round, and reactive to light.  Cardiovascular: Normal rate and regular rhythm.   Pulmonary/Chest: Breath sounds normal. No respiratory distress. She has no wheezes. She has no rales.  Neurological: She is alert and oriented to  person, place, and time.    CBC    Component Value Date/Time   WBC 11.3* 01/05/2007 0041   RBC 4.13 01/05/2007 0041   HGB 12.4 01/05/2007 0041   HCT 37.6 01/05/2007 0041   PLT 174 01/05/2007 0041   MCV 91.1 01/05/2007 0041   LYMPHSABS 0.4* 01/05/2007 0041   MONOABS 0.9* 01/05/2007 0041   EOSABS 0.5 01/05/2007 0041   BASOSABS 0.0 01/05/2007 0041    CMP     Component Value Date/Time   NA 145 01/05/2007 0041   K 4.0 01/05/2007 0041   CL 111 01/05/2007 0041   CO2 28 01/05/2007 0041   GLUCOSE 121* 01/05/2007 0041   BUN 15 01/05/2007 0041   CREATININE 0.78 01/05/2007 0041   CALCIUM 9.0 01/05/2007 0041   PROT 6.7 01/05/2007 0041   ALBUMIN 3.7 01/05/2007 0041   AST 25 01/05/2007 0041   ALT 20 01/05/2007 0041   ALKPHOS 54 01/05/2007 0041   BILITOT 0.8 01/05/2007 0041   GFRNONAA >60 01/05/2007 0041   GFRAA  Value: >60        The eGFR has been calculated using the MDRD equation. This calculation has not been validated in all clinical 01/05/2007 0041    No results found for this basename:  chol,  tri,  ldl    No components found with this basename: hga1c    Lab Results  Component Value Date/Time   AST 25 01/05/2007 12:41 AM    Assessment and Plan  Elevated blood pressure  Patient is advised or lower salt diet and exercise.  Screening - Plan: CBC with Differential, COMPLETE METABOLIC PANEL WITH GFR, TSH, Lipid panel, Vit D  25 hydroxy (rtn osteoporosis monitoring)  Screening for diabetes mellitus (DM) - Plan: Glucose (CBG) is 108 , HgB A1c is 5.9% .  Needs flu shot Given today.  Health Maintenance - -Pap Smear: Patient follows with gynecologist -Mammogram: up todate  -  Return in about 6 weeks (around 05/25/2013).  Doris Cheadle, MD

## 2013-04-13 NOTE — Patient Instructions (Signed)

## 2013-04-20 ENCOUNTER — Encounter: Payer: Self-pay | Admitting: Internal Medicine

## 2013-04-20 ENCOUNTER — Ambulatory Visit: Payer: Self-pay | Attending: Internal Medicine | Admitting: Internal Medicine

## 2013-04-20 VITALS — BP 142/82 | HR 63 | Temp 98.9°F | Resp 16 | Ht 64.0 in | Wt 175.0 lb

## 2013-04-20 DIAGNOSIS — R142 Eructation: Secondary | ICD-10-CM | POA: Insufficient documentation

## 2013-04-20 DIAGNOSIS — R197 Diarrhea, unspecified: Secondary | ICD-10-CM

## 2013-04-20 DIAGNOSIS — R1012 Left upper quadrant pain: Secondary | ICD-10-CM

## 2013-04-20 DIAGNOSIS — R141 Gas pain: Secondary | ICD-10-CM | POA: Insufficient documentation

## 2013-04-20 MED ORDER — DICYCLOMINE HCL 10 MG/5ML PO SOLN: 20.0000 mg | Freq: Three times a day (TID) | ORAL | Status: DC

## 2013-04-20 NOTE — Progress Notes (Signed)
Pt is here having back pain that comes and goes. Right now she has no pain but she gets extreme pain right under her left ribcage of her lower back.

## 2013-04-20 NOTE — Progress Notes (Signed)
Patient ID: Shirley Aguilar, female   DOB: May 29, 1950, 62 y.o.   MRN: [id number] Patient Demographics  Shirley Aguilar, is a 62 y.o. female  NWG:[id number]  [id number]  DOB - 14-Aug-1950  Chief Complaint  Patient presents with  . Follow-up        Subjective:   Shirley Aguilar is a 62 y.o. female here today for a follow up visit. Patient was seen last week. She came today because she forgot to ask Dr. about her abdominal pain, on the left upper quadrant, intermittent, sometimes relieved by a bowel movement, no known aggravating factor. She had colonoscopy about 7 years ago, multiple polyps were removed and she was to have colonoscopy followup in 5 years but has not done that. She also complained of intermittent diarrhea, when intercourse it's about 6 bowel movements a day, nonbloody, nonmucoid. No family history of colon cancer Patient has No headache, No chest pain, No abdominal pain - No Nausea, No new weakness tingling or numbness, No Cough - SOB.  ALLERGIES: No Known Allergies  PAST MEDICAL HISTORY: History reviewed. No pertinent past medical history.  MEDICATIONS AT HOME: Prior to Admission medications   Medication Sig Start Date End Date Taking? Authorizing Provider  Calcium-Vitamin D-Vitamin K (CALCIUM + D + K PO) Take by mouth.   Yes Historical Provider, MD  dicyclomine (BENTYL) 10 MG/5ML syrup Take 10 mLs (20 mg total) by mouth 4 (four) times daily -  before meals and at bedtime. 04/20/13   Jeanann Lewandowsky, MD     Objective:   Filed Vitals:   04/20/13 1616  BP: 142/82  Pulse: 63  Temp: 98.9 F (37.2 C)  TempSrc: Oral  Resp: 16  Height: 5\' 4"  (1.626 m)  Weight: 175 lb (79.379 kg)  SpO2: 99%    Exam General appearance : Awake, alert, not in any distress. Speech Clear. Not toxic looking HEENT: Atraumatic and Normocephalic, pupils equally reactive to light and accomodation Neck: supple, no JVD. No cervical lymphadenopathy.  Chest:Good air entry bilaterally,  no added sounds  CVS: S1 S2 regular, no murmurs.  Abdomen: Bowel sounds present, Non tender and not distended with no gaurding, rigidity or rebound. Extremities: B/L Lower Ext shows no edema, both legs are warm to touch Neurology: Awake alert, and oriented X 3, CN II-XII intact, Non focal Skin:No Rash Wounds:N/A   Data Review   CBC No results found for this basename: WBC, HGB, HCT, PLT, MCV, MCH, MCHC, RDW, NEUTRABS, LYMPHSABS, MONOABS, EOSABS, BASOSABS, BANDABS, BANDSABD,  in the last 168 hours  Chemistries   No results found for this basename: NA, K, CL, CO2, GLUCOSE, BUN, CREATININE, GFRCGP, CALCIUM, MG, AST, ALT, ALKPHOS, BILITOT,  in the last 168 hours ------------------------------------------------------------------------------------------------------------------ No results found for this basename: HGBA1C,  in the last 72 hours ------------------------------------------------------------------------------------------------------------------ No results found for this basename: CHOL, HDL, LDLCALC, TRIG, CHOLHDL, LDLDIRECT,  in the last 72 hours ------------------------------------------------------------------------------------------------------------------ No results found for this basename: TSH, T4TOTAL, FREET3, T3FREE, THYROIDAB,  in the last 72 hours ------------------------------------------------------------------------------------------------------------------ No results found for this basename: VITAMINB12, FOLATE, FERRITIN, TIBC, IRON, RETICCTPCT,  in the last 72 hours  Coagulation profile  No results found for this basename: INR, PROTIME,  in the last 168 hours    Assessment & Plan   We discussed the results of laboratory tests, patient is prediabetic weight high LDL. She has been advised nutritional control and exercise, repeat hemoglobin A1c and lipid profile in 3 months  1. Abdominal pain, left upper quadrant  2. Diarrhea  and abdominal bloating  - dicyclomine  (BENTYL) 10 MG/5ML syrup; Take 10 mLs (20 mg total) by mouth 4 (four) times daily -  before meals and at bedtime.  Dispense: 240 mL; Refill: 3 - Ambulatory referral to Gastroenterology - [facility] COLONOSCOPY   Health Maintenance -Colonoscopy: Scheduled today  -Vaccinations:  -Influenza already given  Follow up in 3 months or when necessary   The patient was given clear instructions to go to ER or return to medical center if symptoms don't improve, worsen or new problems develop. The patient verbalized understanding. The patient was told to call to get lab results if they haven't heard anything in the next week.    Jeanann Lewandowsky, MD, MHA, FACP, FAAP [facility] [place], [place] [phone]   04/20/2013, 4:47 PM

## 2013-04-21 ENCOUNTER — Ambulatory Visit: Payer: Self-pay

## 2013-04-24 ENCOUNTER — Ambulatory Visit: Payer: Self-pay | Attending: Internal Medicine

## 2013-04-27 ENCOUNTER — Encounter: Payer: Self-pay | Admitting: Internal Medicine

## 2013-05-01 ENCOUNTER — Ambulatory Visit: Payer: Self-pay | Admitting: Nurse Practitioner

## 2013-05-01 ENCOUNTER — Telehealth: Payer: Self-pay | Admitting: Internal Medicine

## 2013-05-01 NOTE — Telephone Encounter (Signed)
Pt reports an hard tender area under her ribs on the l side. She reports the area hurts too bad to move and the area is warm to the touch, she reports the pain is getting worse. Pt is private pay and is she can't get $184 she will cancel the appt.

## 2013-05-06 ENCOUNTER — Telehealth: Payer: Self-pay | Admitting: Nurse Practitioner

## 2013-05-06 ENCOUNTER — Ambulatory Visit (INDEPENDENT_AMBULATORY_CARE_PROVIDER_SITE_OTHER): Payer: Self-pay | Admitting: Nurse Practitioner

## 2013-05-06 ENCOUNTER — Encounter: Payer: Self-pay | Admitting: Nurse Practitioner

## 2013-05-06 VITALS — BP 124/88 | HR 68 | Ht 64.0 in | Wt 170.6 lb

## 2013-05-06 DIAGNOSIS — M7918 Myalgia, other site: Secondary | ICD-10-CM | POA: Insufficient documentation

## 2013-05-06 DIAGNOSIS — IMO0001 Reserved for inherently not codable concepts without codable children: Secondary | ICD-10-CM

## 2013-05-06 DIAGNOSIS — R198 Other specified symptoms and signs involving the digestive system and abdomen: Secondary | ICD-10-CM

## 2013-05-06 DIAGNOSIS — R194 Change in bowel habit: Secondary | ICD-10-CM | POA: Insufficient documentation

## 2013-05-06 MED ORDER — MOVIPREP 100 G PO SOLR: 1.0000 | ORAL | Status: DC

## 2013-05-06 MED ORDER — SULINDAC 150 MG PO TABS: 150.0000 mg | ORAL_TABLET | Freq: Every day | ORAL | Status: AC

## 2013-05-06 MED ORDER — METHOCARBAMOL 500 MG PO TABS: ORAL_TABLET | ORAL | Status: AC

## 2013-05-06 NOTE — Patient Instructions (Addendum)
We sent prescriptions to [facility]. 1. Clinoril 2. Robaxin 3. Moviprep  You have been scheduled for a colonoscopy with propofol. Please follow written instructions given to you at your visit today.  We have given you a free coupon for the Moviprep. If you use inhalers (even only as needed), please bring them with you on the day of your procedure.

## 2013-05-06 NOTE — Telephone Encounter (Signed)
See phone note from 05-06-2013.  Already taken care of.

## 2013-05-06 NOTE — Progress Notes (Signed)
HPI :  Patient is a 62 year old female, new to this practice, referred for evaluation of left upper quadrant pain. Patient had a colonoscopy approximately 7 years ago at [facility], records have been requested.   Four months ago patient developed problems with postprandial diffuse abdominal cramping. Cramps were self-limiting, not necessarily related to meals. This went on for several weeks but then cramps resolved approx two months ago. Then, a month ago patient began having pain over lower aspect of her left lateral rib cage. Pain was definitely worse with exercising, bowling, deep breaths and some position changes.  She can't really relate pain to eating other than one one occasion after eating lasagna. No nausea or vomiting. She does describe bowel changes. Stools have become more narrow, paler in color and less frequent. PCP gave her rx for Bentyl, it hasn't helped the pain. No blood in stool. No unusal weight loss, weight has actually increased over last two months. Patient is s/p total hysterectomy 20 years ago. She is s/p cholecystectomy five years ago. CMET, CMET and TSH late November were unremarkable.     Past Medical History  Diagnosis Date  . Colon polyps   . Depression   . Gallstones   . Hyperlipidemia   . Kidney stones     Family History  Problem Relation Age of Onset  . Hypertension Mother   . Hypertension Sister   . Diabetes Maternal Uncle   . Leukemia Maternal Grandmother   . Diabetes Maternal Aunt     x 2  . Diabetes Mother   . Colon cancer Neg Hx    History  Substance Use Topics  . Smoking status: Never Smoker   . Smokeless tobacco: Never Used  . Alcohol Use: No   Current Outpatient Prescriptions  Medication Sig Dispense Refill  . Calcium-Vitamin D-Vitamin K (CALCIUM + D + K PO) Take by mouth.       No current facility-administered medications for this visit.   No Known Allergies   Review of Systems: Positive for back pain, vision  changes, depression, excessive urination and urine leakage. All other systems reviewed and negative except where noted in HPI.   Physical Exam: BP 124/88  Pulse 68  Ht 5\' 4"  (1.626 m)  Wt 170 lb 9.6 oz (77.384 kg)  BMI 29.27 kg/m2 Constitutional: Pleasant,well-developed, black female in no acute distress. HEENT: Normocephalic and atraumatic. Conjunctivae are normal. No scleral icterus. Neck supple.  Cardiovascular: Normal rate, regular rhythm.  Pulmonary/chest: Effort normal and breath sounds normal. No wheezing, rales or rhonchi. Abdominal: Soft, nondistended, nontender. Bowel sounds active throughout. There are no masses palpable. No hepatomegaly. Musculoskeletal: tenderness lower edge of left lateral ribcage Extremities: no edema Lymphadenopathy: No cervical adenopathy noted. Neurological: Alert and oriented to person place and time. Skin: Skin is warm and dry. No rashes noted. Psychiatric: Normal mood and affect. Behavior is normal.   ASSESSMENT AND PLAN:  4. 62 year old female with one month history of pain over lower aspect of left lateral ribcage. Pain worse with exercise / position changes. Suspect this is musculoskeletal pain. She has no abdominal pain. Recommended two week trial of anti-inflammatories and NSAIDs. Sample of PPI given to take with NSAID.  2. Bowel changes. For further evaluation patient , history of polyps. The risks, benefits, and alternatives to colonoscopy with possible biopsy and possible polypectomy were discussed with the patient and she consents to proceed.   3. history of colon polyps. Last colonoscopy approximately 7 years ago  with [facility]. Records have been requested. Patient was advised to have followup colonoscopy at five years, she is overdue. She will be scheduled for surveillance colonoscopy

## 2013-05-06 NOTE — Telephone Encounter (Signed)
I spoke to the patient.  I called her to advise I have samples at our front desk for her of Nexium 40 mg.  She is to take 1 capsule daily to protect her stomach while she is taking he Robaxin and the Clinoril for Muscular Skeletal pain. Also She asked me to call in the Moviprep to [facility] [address] which I did.  I also advised [facility] that the patient has a free coupon for the prep.

## 2013-05-07 ENCOUNTER — Other Ambulatory Visit: Payer: Self-pay | Admitting: *Deleted

## 2013-05-07 DIAGNOSIS — M7918 Myalgia, other site: Secondary | ICD-10-CM

## 2013-05-07 DIAGNOSIS — R194 Change in bowel habit: Secondary | ICD-10-CM

## 2013-05-07 MED ORDER — IBUPROFEN 800 MG PO TABS: ORAL_TABLET | ORAL | Status: AC

## 2013-05-07 MED ORDER — MOVIPREP 100 G PO SOLR: 1.0000 | ORAL | Status: AC

## 2013-05-07 NOTE — Progress Notes (Signed)
Agree with initial assessment and plans as outlined by PG,NP

## 2013-05-25 ENCOUNTER — Ambulatory Visit: Payer: Self-pay | Admitting: Internal Medicine

## 2013-05-27 ENCOUNTER — Ambulatory Visit: Payer: Self-pay | Admitting: Internal Medicine

## 2013-06-01 ENCOUNTER — Telehealth: Payer: Self-pay | Admitting: Internal Medicine

## 2013-06-01 NOTE — Telephone Encounter (Signed)
Yes. Should have thought of this sooner

## 2013-06-02 ENCOUNTER — Encounter: Payer: Self-pay | Admitting: Internal Medicine

## 2013-12-11 ENCOUNTER — Encounter: Payer: Self-pay | Admitting: Interventional Cardiology

## 2014-08-18 ENCOUNTER — Encounter: Payer: Self-pay | Admitting: Sports Medicine

## 2014-08-18 ENCOUNTER — Ambulatory Visit (INDEPENDENT_AMBULATORY_CARE_PROVIDER_SITE_OTHER): Payer: Self-pay | Admitting: Sports Medicine

## 2014-08-18 VITALS — BP 138/85 | Ht 63.5 in | Wt 169.0 lb

## 2014-08-18 DIAGNOSIS — M25561 Pain in right knee: Secondary | ICD-10-CM

## 2014-08-18 MED ORDER — METHYLPREDNISOLONE ACETATE 40 MG/ML IJ SUSP
40.0000 mg | Freq: Once | INTRAMUSCULAR | Status: AC
  Administered 2014-08-18: 40 mg via INTRA_ARTICULAR

## 2014-08-19 NOTE — Progress Notes (Signed)
  Shirley MarchGeraldine Ellett - 64 y.o. female MRN [id number]  Date of birth: 19-Feb-1951  SUBJECTIVE: CC: 1. Right Knee Pain, initial evaluation     HPI:  Insidious knee pain worsening over the past several months  No significant injury  + Theater sign  Pain is worse with full extension and terminal flexion  No falls associated with knee giving way but occasional instability sx  No locking  Generalized pain  No radicular component  No night time awakenings, no recent weight loss, fevers, chills or night sweats     ROS:  Per HPI   HISTORY:  Past Medical, Surgical, Social, and Family History reviewed & updated per EMR.  Pertinent Historical Findings include: Social History   Occupational History  . Not on file.   Social History Main Topics  . Smoking status: Never Smoker   . Smokeless tobacco: Never Used  . Alcohol Use: No  . Drug Use: No  . Sexual Activity: Not on file    No specialty comments available. Problem  Right Knee Pain   No prior x-rays Presumptive degenerative changes Injection on 08/18/2014      OBJECTIVE:  VS:   HT:5' 3.5" (161.3 cm)   WT:169 lb (76.658 kg)  BMI:29.5          BP:138/85 mmHg  HR: bpm  TEMP: ( )  RESP:   PHYSICAL EXAM: GENERAL: Adult female. No acute distress PSYCH: Alert and appropriately interactive. SKIN: No open skin lesions or abnormal skin markings on areas inspected as below VASCULAR: DP and PT pulses 1+/4 NEURO: Lower extremity strength is 5+/5 in all myotomes; sensation is intact to light touch in all dermatomes. 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 Positive patellar grind, stable to varus and valgus strain. Medial joint line tenderness. No significant mechanical symptoms with McMurray's. Negative Thessaly. Full flexion and extension.  DATA OBTAINED: No notes on file  ASSESSMENT & PLAN: See problem based charting & AVS for additional documentation Problem List Items Addressed This Visit      Right knee pain - Primary    Recommend compression Defer x-ray unless significant worsening.   PROCEDURE NOTE : Right knee Injection After discussing the risks, benefits and expected outcomes of the injection and all questions were reviewed and answered,  she wished to undergo the above named procedure.  Written consent was obtained. After an appropriate time out was taken the right knee was sterilely prepped and injected as below: Prep:    Betadine and alcohol,  Ethel chloride.  Approach:  Anterior medial, Bent knee Needle:  22-gauge 1.5 inch needle Meds:   2 mL of 0.25% Marcaine, 2 mL 1% lidocaine, 1 mL 40 mg Depo-Medrol A bandaid was applied to the area. This procedure was well tolerated and there were no complications.        Relevant Medications   methylPREDNISolone acetate (DEPO-MEDROL) injection 40 mg (Completed)      FOLLOW UP:  Return if symptoms worsen or fail to improve.

## 2014-08-24 ENCOUNTER — Encounter: Payer: Self-pay | Admitting: Sports Medicine

## 2014-08-24 DIAGNOSIS — M25561 Pain in right knee: Secondary | ICD-10-CM | POA: Insufficient documentation

## 2014-08-24 NOTE — Assessment & Plan Note (Addendum)
Recommend compression Defer x-ray unless significant worsening.   PROCEDURE NOTE : Right knee Injection After discussing the risks, benefits and expected outcomes of the injection and all questions were reviewed and answered,  she wished to undergo the above named procedure.  Written consent was obtained. After an appropriate time out was taken the right knee was sterilely prepped and injected as below: Prep:    Betadine and alcohol,  Ethel chloride.  Approach:  Anterior medial, Bent knee Needle:  22-gauge 1.5 inch needle Meds:   2 mL of 0.25% Marcaine, 2 mL 1% lidocaine, 1 mL 40 mg Depo-Medrol A bandaid was applied to the area. This procedure was well tolerated and there were no complications.

## 2015-05-10 ENCOUNTER — Other Ambulatory Visit: Payer: Self-pay | Admitting: Orthopedic Surgery

## 2015-05-26 ENCOUNTER — Inpatient Hospital Stay (HOSPITAL_COMMUNITY)
Admission: RE | Admit: 2015-05-26 | Discharge: 2015-05-26 | Disposition: A | Discharge status: Home / Self Care | Payer: Self-pay | Source: Ambulatory Visit

## 2015-06-06 ENCOUNTER — Inpatient Hospital Stay (HOSPITAL_COMMUNITY): Admission: RE | Admit: 2015-06-06 | Payer: Self-pay | Source: Ambulatory Visit | Admitting: Orthopedic Surgery

## 2015-06-06 ENCOUNTER — Encounter (HOSPITAL_COMMUNITY): Admission: RE | Payer: Self-pay | Source: Ambulatory Visit

## 2015-06-06 SURGERY — ARTHROPLASTY, KNEE, TOTAL
Anesthesia: General | Laterality: Right

## 2015-11-18 ENCOUNTER — Encounter: Payer: Self-pay | Admitting: Gastroenterology

## 2015-11-29 ENCOUNTER — Ambulatory Visit (AMBULATORY_SURGERY_CENTER): Payer: Self-pay

## 2015-11-29 VITALS — Ht 63.5 in | Wt 173.8 lb

## 2015-11-29 DIAGNOSIS — Z86010 Personal history of colonic polyps: Secondary | ICD-10-CM

## 2015-11-29 MED ORDER — NA SULFATE-K SULFATE-MG SULF 17.5-3.13-1.6 GM/177ML PO SOLN: ORAL | Status: AC

## 2015-11-29 NOTE — Progress Notes (Signed)
Per pt, no allergies to soy.Pt not taking any weight loss meds or using  O2 at home.   PER PT-"ALLERGIC TO EGGS!"  CAUSES NAUSEA AND VOMITING

## 2015-11-30 ENCOUNTER — Encounter: Payer: Self-pay | Admitting: Gastroenterology

## 2015-12-13 ENCOUNTER — Encounter: Payer: Self-pay | Admitting: Gastroenterology

## 2016-01-19 ENCOUNTER — Encounter: Payer: Self-pay | Admitting: Family Medicine

## 2020-08-25 ENCOUNTER — Other Ambulatory Visit: Payer: Self-pay | Admitting: Physician Assistant

## 2020-08-25 DIAGNOSIS — R1030 Lower abdominal pain, unspecified: Secondary | ICD-10-CM

## 2020-09-09 ENCOUNTER — Ambulatory Visit
Admission: RE | Admit: 2020-09-09 | Discharge: 2020-09-09 | Disposition: A | Discharge status: Home / Self Care | Payer: Medicare Other | Source: Ambulatory Visit | Attending: Physician Assistant | Admitting: Physician Assistant

## 2020-09-09 ENCOUNTER — Inpatient Hospital Stay: Admission: RE | Admit: 2020-09-09 | Payer: Medicare Other | Source: Ambulatory Visit

## 2020-09-09 DIAGNOSIS — R1030 Lower abdominal pain, unspecified: Secondary | ICD-10-CM

## 2020-09-09 MED ORDER — IOPAMIDOL (ISOVUE-300) INJECTION 61%
100.0000 mL | Freq: Once | INTRAVENOUS | Status: AC | PRN
  Administered 2020-09-09: 100 mL via INTRAVENOUS
